# Patient Record
Sex: Male | Born: 1995
Health system: Southern US, Community
[De-identification: ages and names within clinical notes are randomized; demographics above are authoritative.]

## PROBLEM LIST (undated history)

## (undated) DIAGNOSIS — J45909 Unspecified asthma, uncomplicated: Secondary | ICD-10-CM

## (undated) DIAGNOSIS — E663 Overweight: Secondary | ICD-10-CM

## (undated) HISTORY — DX: Unspecified asthma, uncomplicated: J45.909

## (undated) HISTORY — DX: Overweight: E66.3

---
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
## 2015-06-16 MED FILL — VENTOLIN HFA 90 MCG INHALER: 108 (90 BAS | 16 days supply | Qty: 18 | Fill #2

## 2015-06-16 MED FILL — MONTELUKAST SOD 10 MG TAB: 10 | 90 days supply | Qty: 90 | Fill #1

## 2015-07-11 DIAGNOSIS — H10413 Chronic giant papillary conjunctivitis, bilateral: Secondary | ICD-10-CM | POA: Diagnosis not present

## 2015-07-11 MED FILL — VENTOLIN HFA 90 MCG INHALER: 108 (90 BAS | 17 days supply | Qty: 18 | Fill #0

## 2015-10-08 DIAGNOSIS — J309 Allergic rhinitis, unspecified: Secondary | ICD-10-CM | POA: Diagnosis not present

## 2015-10-08 DIAGNOSIS — J45990 Exercise induced bronchospasm: Secondary | ICD-10-CM | POA: Diagnosis not present

## 2015-10-08 DIAGNOSIS — J45998 Other asthma: Secondary | ICD-10-CM | POA: Diagnosis not present

## 2015-10-08 MED FILL — VENTOLIN HFA 90 MCG INHALER: 108 (90 BAS | 30 days supply | Qty: 36 | Fill #0

## 2015-10-08 MED FILL — MONTELUKAST SOD 10 MG TAB: 10 | 90 days supply | Qty: 90 | Fill #0

## 2015-12-08 DIAGNOSIS — S0990XA Unspecified injury of head, initial encounter: Secondary | ICD-10-CM | POA: Diagnosis not present

## 2015-12-08 DIAGNOSIS — Z7189 Other specified counseling: Secondary | ICD-10-CM | POA: Diagnosis not present

## 2015-12-08 DIAGNOSIS — Z23 Encounter for immunization: Secondary | ICD-10-CM | POA: Diagnosis not present

## 2015-12-24 MED FILL — MONTELUKAST SOD 10 MG TAB: 10 | 90 days supply | Qty: 90 | Fill #1

## 2015-12-24 MED FILL — VENTOLIN HFA 90 MCG INHALER: 108 (90 BAS | 30 days supply | Qty: 36 | Fill #1

## 2016-01-29 DIAGNOSIS — Z23 Encounter for immunization: Secondary | ICD-10-CM | POA: Diagnosis not present

## 2016-02-03 DIAGNOSIS — J45990 Exercise induced bronchospasm: Secondary | ICD-10-CM | POA: Diagnosis not present

## 2016-02-03 MED FILL — VENTOLIN HFA 90 MCG INHALER: 108 (90 BAS | 30 days supply | Qty: 36 | Fill #0

## 2016-04-05 MED FILL — MONTELUKAST SOD 10 MG TAB: 10 | 90 days supply | Qty: 90 | Fill #0

## 2016-04-07 MED FILL — VENTOLIN HFA 90 MCG INHALER: 108 (90 BAS | 30 days supply | Qty: 36 | Fill #1

## 2016-07-07 MED FILL — VENTOLIN HFA 90 MCG INHALER: 108 (90 BAS | 30 days supply | Qty: 36 | Fill #2

## 2016-07-07 MED FILL — MONTELUKAST SOD 10 MG TAB: 10 | 90 days supply | Qty: 90 | Fill #0

## 2016-07-09 DIAGNOSIS — J45998 Other asthma: Secondary | ICD-10-CM | POA: Diagnosis not present

## 2016-07-09 DIAGNOSIS — Z13220 Encounter for screening for lipoid disorders: Secondary | ICD-10-CM | POA: Diagnosis not present

## 2016-07-09 DIAGNOSIS — J309 Allergic rhinitis, unspecified: Secondary | ICD-10-CM | POA: Diagnosis not present

## 2016-07-09 DIAGNOSIS — J45990 Exercise induced bronchospasm: Secondary | ICD-10-CM | POA: Diagnosis not present

## 2016-07-09 DIAGNOSIS — Z833 Family history of diabetes mellitus: Secondary | ICD-10-CM | POA: Diagnosis not present

## 2016-07-10 DIAGNOSIS — J069 Acute upper respiratory infection, unspecified: Secondary | ICD-10-CM | POA: Diagnosis not present

## 2016-08-17 MED FILL — VENTOLIN HFA 90 MCG INHALER: 108 (90 BAS | 30 days supply | Qty: 36 | Fill #0

## 2016-09-01 DIAGNOSIS — H52221 Regular astigmatism, right eye: Secondary | ICD-10-CM | POA: Diagnosis not present

## 2016-09-15 DIAGNOSIS — R0981 Nasal congestion: Secondary | ICD-10-CM | POA: Diagnosis not present

## 2016-09-16 ENCOUNTER — Ambulatory Visit (HOSPITAL_COMMUNITY)
Admission: EM | Admit: 2016-09-16 | Discharge: 2016-09-16 | Disposition: A | Discharge status: Home / Self Care | Payer: 59 | Attending: Family Medicine | Admitting: Family Medicine

## 2016-09-16 ENCOUNTER — Encounter (HOSPITAL_COMMUNITY): Payer: Self-pay | Admitting: Emergency Medicine

## 2016-09-16 DIAGNOSIS — R05 Cough: Secondary | ICD-10-CM | POA: Diagnosis not present

## 2016-09-16 DIAGNOSIS — J039 Acute tonsillitis, unspecified: Secondary | ICD-10-CM

## 2016-09-16 DIAGNOSIS — R059 Cough, unspecified: Secondary | ICD-10-CM

## 2016-09-16 MED ORDER — AMOXICILLIN 875 MG PO TABS: 875.0000 mg | ORAL_TABLET | Freq: Two times a day (BID) | ORAL | 0 refills | Status: DC

## 2016-09-16 MED ORDER — HYDROCODONE-HOMATROPINE 5-1.5 MG/5ML PO SYRP: 5.0000 mL | ORAL_SOLUTION | Freq: Four times a day (QID) | ORAL | 0 refills | Status: DC | PRN

## 2016-09-16 MED FILL — HYDROCODONE-HOMATROPINE SYR: 5-1.5 | 3 days supply | Qty: 60 | Fill #0

## 2016-09-16 MED FILL — AMOXICILLIN 875 MG TABLET: 875 | 10 days supply | Qty: 20 | Fill #0

## 2016-09-16 NOTE — ED Triage Notes (Signed)
Here for a fever onset 3 days associated w/fatigue, back pain, dry cough, weakness  Reports he wen to his PCP yest and tested neg for strep and flu  Last had ibup at 1300  A&O x4... NAD

## 2016-09-16 NOTE — ED Provider Notes (Signed)
[facility]    CSN: [id number] Arrival date & time: 09/16/16  1633     History   Chief Complaint Chief Complaint  Patient presents with  . Fever    HPI Frank Brooks is a 21 y.o. male.   Here for a fever onset 3 days associated w/fatigue, back pain, dry cough, weakness  Reports he wen to his PCP yest and tested neg for strep and flu  Last had ibup at 1300      History reviewed. No pertinent past medical history.  There are no active problems to display for this patient.   History reviewed. No pertinent surgical history.     Home Medications    Prior to Admission medications   Medication Sig Start Date End Date Taking? Authorizing Provider  amoxicillin (AMOXIL) 875 MG tablet Take 1 tablet (875 mg total) by mouth 2 (two) times daily. 09/16/16   Elvina Sidle, MD  HYDROcodone-homatropine (HYDROMET) 5-1.5 MG/5ML syrup Take 5 mLs by mouth every 6 (six) hours as needed for cough. 09/16/16   Elvina Sidle, MD    Family History History reviewed. No pertinent family history.  Social History Social History  Substance Use Topics  . Smoking status: Never Smoker  . Smokeless tobacco: Never Used  . Alcohol use Yes     Allergies   Patient has no known allergies.   Review of Systems Review of Systems  Constitutional: Positive for chills, diaphoresis, fatigue and fever.  HENT: Positive for sore throat.   Respiratory: Positive for cough.   Gastrointestinal: Positive for nausea.  Musculoskeletal: Positive for myalgias.  Neurological: Negative.      Physical Exam Triage Vital Signs ED Triage Vitals  Enc Vitals Group     BP 09/16/16 1706 135/71     Pulse Rate 09/16/16 1706 73     Resp 09/16/16 1706 16     Temp 09/16/16 1706 98.2 F (36.8 C)     Temp Source 09/16/16 1706 Oral     SpO2 09/16/16 1706 98 %     Weight --      Height --      Head Circumference --      Peak Flow --      Pain Score 09/16/16 1708 5     Pain Loc --      Pain  Edu? --      Excl. in GC? --    No data found.   Updated Vital Signs BP 135/71 (BP Location: Left Arm)   Pulse 73   Temp 98.2 F (36.8 C) (Oral)   Resp 16   SpO2 98%    Physical Exam  Constitutional: He is oriented to person, place, and time. He appears well-developed and well-nourished.  HENT:  Right Ear: External ear normal.  Left Ear: External ear normal.  Face is flushed Tonsils are swollen and red with no exudates  Eyes: Conjunctivae and EOM are normal. Pupils are equal, round, and reactive to light.  Neck: Normal range of motion.  Cardiovascular: Normal rate, regular rhythm and normal heart sounds.   Pulmonary/Chest: Effort normal and breath sounds normal.  Musculoskeletal: Normal range of motion.  Neurological: He is alert and oriented to person, place, and time.  Skin: Skin is warm and dry.  Nursing note and vitals reviewed.    UC Treatments / Results  Labs (all labs ordered are listed, but only abnormal results are displayed) Labs Reviewed - No data to display  EKG  EKG Interpretation None  Radiology No results found.  Procedures Procedures (including critical care time)  Medications Ordered in UC Medications - No data to display   Initial Impression / Assessment and Plan / UC Course  I have reviewed the triage vital signs and the nursing notes.  Pertinent labs & imaging results that were available during my care of the patient were reviewed by me and considered in my medical decision making (see chart for details).     Final Clinical Impressions(s) / UC Diagnoses   Final diagnoses:  Tonsillitis  Cough    New Prescriptions New Prescriptions   AMOXICILLIN (AMOXIL) 875 MG TABLET    Take 1 tablet (875 mg total) by mouth 2 (two) times daily.   HYDROCODONE-HOMATROPINE (HYDROMET) 5-1.5 MG/5ML SYRUP    Take 5 mLs by mouth every 6 (six) hours as needed for cough.     Elvina Sidle, MD 09/16/16 1736

## 2016-10-20 MED FILL — MONTELUKAST SOD 10 MG TAB: 10 | 90 days supply | Qty: 90 | Fill #0

## 2016-10-20 MED FILL — VENTOLIN HFA 90 MCG INHALER: 108 (90 BAS | 30 days supply | Qty: 36 | Fill #1

## 2017-02-21 MED FILL — MONTELUKAST SOD 10 MG TAB: 10 | 90 days supply | Qty: 90 | Fill #1

## 2017-02-21 MED FILL — VENTOLIN HFA 90 MCG INHALER: 108 (90 BAS | 30 days supply | Qty: 36 | Fill #2

## 2017-03-28 DIAGNOSIS — J039 Acute tonsillitis, unspecified: Secondary | ICD-10-CM | POA: Diagnosis not present

## 2017-04-21 MED FILL — VENTOLIN HFA 90 MCG INHALER: 108 (90 BAS | 30 days supply | Qty: 36 | Fill #0

## 2017-06-24 MED FILL — VENTOLIN HFA 90 MCG INHALER: 108 (90 BAS | 30 days supply | Qty: 36 | Fill #0

## 2017-06-24 MED FILL — MONTELUKAST SOD 10 MG TAB: 10 | 90 days supply | Qty: 90 | Fill #0

## 2017-08-07 ENCOUNTER — Ambulatory Visit (HOSPITAL_COMMUNITY)
Admission: EM | Admit: 2017-08-07 | Discharge: 2017-08-07 | Disposition: A | Discharge status: Home / Self Care | Payer: No Typology Code available for payment source | Attending: Physician Assistant | Admitting: Physician Assistant

## 2017-08-07 ENCOUNTER — Other Ambulatory Visit: Payer: Self-pay

## 2017-08-07 ENCOUNTER — Encounter (HOSPITAL_COMMUNITY): Payer: Self-pay | Admitting: *Deleted

## 2017-08-07 DIAGNOSIS — J029 Acute pharyngitis, unspecified: Secondary | ICD-10-CM | POA: Insufficient documentation

## 2017-08-07 LAB — CBC WITH DIFFERENTIAL/PLATELET
Basophils Absolute: 0.1 10*3/uL (ref 0.0–0.1)
Basophils Relative: 1 %
Eosinophils Absolute: 0.1 10*3/uL (ref 0.0–0.7)
Eosinophils Relative: 2 %
HCT: 43.8 % (ref 39.0–52.0)
Hemoglobin: 15.5 g/dL (ref 13.0–17.0)
LYMPHS PCT: 25 %
Lymphs Abs: 1.3 10*3/uL (ref 0.7–4.0)
MCH: 32.4 pg (ref 26.0–34.0)
MCHC: 35.4 g/dL (ref 30.0–36.0)
MCV: 91.6 fL (ref 78.0–100.0)
MONO ABS: 0.7 10*3/uL (ref 0.1–1.0)
Monocytes Relative: 13 %
Neutro Abs: 3.1 10*3/uL (ref 1.7–7.7)
Neutrophils Relative %: 59 %
Platelets: 210 10*3/uL (ref 150–400)
RBC: 4.78 MIL/uL (ref 4.22–5.81)
RDW: 11.9 % (ref 11.5–15.5)
WBC: 5.2 10*3/uL (ref 4.0–10.5)

## 2017-08-07 LAB — POCT INFECTIOUS MONO SCREEN: Mono Screen: NEGATIVE

## 2017-08-07 LAB — POCT RAPID STREP A: Streptococcus, Group A Screen (Direct): NEGATIVE

## 2017-08-07 MED ORDER — NAPROXEN 500 MG PO TABS: 500.0000 mg | ORAL_TABLET | Freq: Two times a day (BID) | ORAL | 0 refills | Status: DC

## 2017-08-07 MED ORDER — AZITHROMYCIN 250 MG PO TABS: ORAL_TABLET | ORAL | 0 refills | Status: AC

## 2017-08-07 MED ORDER — LIDOCAINE VISCOUS 2 % MT SOLN: 5.0000 mL | OROMUCOSAL | 0 refills | Status: DC | PRN

## 2017-08-07 NOTE — ED Triage Notes (Signed)
Sore throat for 2 weeks

## 2017-08-07 NOTE — ED Provider Notes (Signed)
08/07/2017 2:04 PM   DOB: 05/01/1996 / MRN: [id number]  SUBJECTIVE:  Frank Brooks is a 22 y.o. male presenting for sore throat that started 2 weeks ago.  He tells me the symptoms are getting worse.  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  He denies weight loss.  Denies GERD.  He is a non-smoker.  He denies sneezing, ear itching, mouth itching, watery eyes and eye itching.  He denies fever.  He has No Known Allergies.   He  has no past medical history on file.    He  reports that  has never smoked. he has never used smokeless tobacco. He reports that he drinks about 0.6 oz of alcohol per week. He reports that he does not use drugs. He  has no sexual activity history on file. The patient  has no past surgical history on file.  His family history is not on file.  ROS  As per HPI otherwise negative  OBJECTIVE:  BP (!) 122/110 (BP Location: Left Arm)   Pulse 74   Temp 98.1 F (36.7 C) (Oral)   SpO2 96%   Blood pressure rechecked by me at  Physical Exam  Constitutional: He appears well-developed. He is active and cooperative.  Non-toxic appearance.  HENT:  Right Ear: Hearing, tympanic membrane, external ear and ear canal normal.  Left Ear: Hearing, tympanic membrane, external ear and ear canal normal.  Nose: Nose normal. Right sinus exhibits no maxillary sinus tenderness and no frontal sinus tenderness. Left sinus exhibits no maxillary sinus tenderness and no frontal sinus tenderness.  Mouth/Throat: Uvula is midline, oropharynx is clear and moist and mucous membranes are normal. No oropharyngeal exudate, posterior oropharyngeal edema or tonsillar abscesses.  Eyes: Conjunctivae are normal. Pupils are equal, round, and reactive to light.  Cardiovascular: Normal rate, regular rhythm, S1 normal, S2 normal, normal heart sounds, intact distal pulses and normal pulses. Exam reveals no gallop and no friction rub.  No murmur heard. Pulmonary/Chest: Effort normal. No stridor. No  tachypnea. No respiratory distress. He has no wheezes. He has no rales.  Abdominal: He exhibits no distension.  Musculoskeletal: He exhibits no edema.  Lymphadenopathy:       Head (right side): No submandibular and no tonsillar adenopathy present.       Head (left side): No submandibular and no tonsillar adenopathy present.    He has no cervical adenopathy.  Neurological: He is alert.  Skin: Skin is warm and dry. He is not diaphoretic. No pallor.  Vitals reviewed.   Results for orders placed or performed during the hospital encounter of 08/07/17 (from the past 72 hour(s))  POCT rapid strep A [facility])     Status: None   Collection Time: 08/07/17  2:00 PM  Result Value Ref Range   Streptococcus, Group A Screen (Direct) NEGATIVE NEGATIVE    No results found.  ASSESSMENT AND PLAN:  Orders Placed This Encounter  Procedures  . Culture, group A strep    Standing Status:   Standing    Number of Occurrences:   1  . CBC with Differential    Standing Status:   Standing    Number of Occurrences:   1  . POCT rapid strep A [facility])    Standing Status:   Standing    Number of Occurrences:   1     Sore throat rapid negative.  He has been having symptoms now for about 2 weeks with an  acute worsening today.  I will cover for strep throat and the culture is sent.  I am checking a CBC as this may be mono as well.  He has no lymphadenopathy about the head and neck.  I have given him viscous lidocaine for pain along with Aleve and advised if his strep is negative we will call and stop the antibiotic and he will have to stick with Aleve and lidocaine to give a virus time to resolve.  He does not participate in athletics.      The patient is advised to call or return to clinic if he does not see an improvement in symptoms, or to seek the care of the closest emergency department if he worsens with the above plan.   Deliah Boston, MHS, PA-C 08/07/2017 2:04 PM    Ofilia Neas,  PA-C 08/07/17 [phone]

## 2017-08-10 LAB — CULTURE, GROUP A STREP (THRC)

## 2017-12-05 MED FILL — VENTOLIN HFA 90 MCG INHALER: 108 (90 BAS | 33 days supply | Qty: 36 | Fill #1

## 2018-05-11 MED FILL — VENTOLIN HFA 90 MCG INHALER: 108 (90 BAS | 33 days supply | Qty: 36 | Fill #2

## 2018-07-05 ENCOUNTER — Encounter (HOSPITAL_COMMUNITY): Payer: Self-pay

## 2018-07-05 ENCOUNTER — Ambulatory Visit (HOSPITAL_COMMUNITY)
Admission: EM | Admit: 2018-07-05 | Discharge: 2018-07-05 | Disposition: A | Discharge status: Home / Self Care | Payer: No Typology Code available for payment source | Attending: Family Medicine | Admitting: Family Medicine

## 2018-07-05 DIAGNOSIS — L259 Unspecified contact dermatitis, unspecified cause: Secondary | ICD-10-CM | POA: Diagnosis not present

## 2018-07-05 MED ORDER — PREDNISONE 20 MG PO TABS: 20.0000 mg | ORAL_TABLET | Freq: Two times a day (BID) | ORAL | 0 refills | Status: AC

## 2018-07-05 MED ORDER — HYDROXYZINE HCL 25 MG PO TABS: 25.0000 mg | ORAL_TABLET | Freq: Every evening | ORAL | 0 refills | Status: DC | PRN

## 2018-07-05 MED FILL — hydrOXYzine HCL 25 MG TABS: 25 | 8 days supply | Qty: 8 | Fill #0

## 2018-07-05 MED FILL — predniSONE 20 MG TABS: 20 | 5 days supply | Qty: 10 | Fill #0

## 2018-07-05 NOTE — ED Provider Notes (Signed)
[facility]   [id number] 07/05/18 Arrival Time: 7893  CC: SKIN COMPLAINT  SUBJECTIVE:  Frank Brooks is a 23 y.o. male who presents with a skin rash to bilateral forearms, neck, and face x 1 day.  Denies precipitating event or trauma.  Denies changes in soaps, detergents, close contacts with similar rash, known trigger or allergy. Denies medications change or starting a new medication recently.  Rash diffuse about the bilateral forearms, neck, and face.  Describes it as itchy, red, and spreading.  Has tried benadryl with temporary relief.  Denies aggravating factors.  Reports similar symptoms in the past with poison ivy exposure.  Denies fever, chills, nausea, vomiting, swelling, discharge, oral lesions, SOB, chest pain, abdominal pain, changes in bowel or bladder function.    ROS: As per HPI.  History reviewed. No pertinent past medical history. History reviewed. No pertinent surgical history. Allergies  Allergen Reactions  . Dust Mite Extract   . Latex    No current facility-administered medications on file prior to encounter.    No current outpatient medications on file prior to encounter.   Social History   Socioeconomic History  . Marital status: Single    Spouse name: Not on file  . Number of children: Not on file  . Years of education: Not on file  . Highest education level: Not on file  Occupational History  . Not on file  Social Needs  . Financial resource strain: Not on file  . Food insecurity:    Worry: Not on file    Inability: Not on file  . Transportation needs:    Medical: Not on file    Non-medical: Not on file  Tobacco Use  . Smoking status: Never Smoker  . Smokeless tobacco: Never Used  Substance and Sexual Activity  . Alcohol use: Yes    Alcohol/week: 1.0 standard drinks    Types: 1 Cans of beer per week    Comment: Occas.  . Drug use: No  . Sexual activity: Not on file  Lifestyle  . Physical activity:    Days per week: Not on file      Minutes per session: Not on file  . Stress: Not on file  Relationships  . Social connections:    Talks on phone: Not on file    Gets together: Not on file    Attends religious service: Not on file    Active member of club or organization: Not on file    Attends meetings of clubs or organizations: Not on file    Relationship status: Not on file  . Intimate partner violence:    Fear of current or ex partner: Not on file    Emotionally abused: Not on file    Physically abused: Not on file    Forced sexual activity: Not on file  Other Topics Concern  . Not on file  Social History Narrative  . Not on file   Family History  Problem Relation Age of Onset  . Healthy Mother   . Healthy Father     OBJECTIVE: Vitals:   07/05/18 0837  BP: (!) 147/76  Pulse: 85  Resp: 18  Temp: 97.8 F (36.6 C)  TempSrc: Oral  SpO2: 96%    General appearance: alert; no distress Head: NCAT Lungs: clear to auscultation bilaterally Heart: regular rate and rhythm.  Radial pulse 2+ bilaterally Extremities: no edema Skin: warm and dry; erythematous maculopapular rash diffuse about bilateral forearms with minimal involvement of posterior neck,  and nasolabial folds; blanches with pressure; NT; no obvious bleeding or drainage (see picture below) Psychological: alert and cooperative; normal mood and affect      ASSESSMENT & PLAN:  1. Contact dermatitis, unspecified contact dermatitis type, unspecified trigger     Meds ordered this encounter  Medications  . predniSONE (DELTASONE) 20 MG tablet    Sig: Take 1 tablet (20 mg total) by mouth 2 (two) times daily with a meal for 5 days.    Dispense:  10 tablet    Refill:  0    Order Specific Question:   Supervising Provider    Answer:   Eustace MooreNELSON, YVONNE SUE [[id number]]  . hydrOXYzine (ATARAX/VISTARIL) 25 MG tablet    Sig: Take 1 tablet (25 mg total) by mouth at bedtime as needed for itching.    Dispense:  8 tablet    Refill:  0    Order Specific  Question:   Supervising Provider    Answer:   Eustace MooreELSON, YVONNE SUE [[id number]]    Wash with warm water and mild soap Take oral steroid as prescribed and to completion Prescribed hydroxyzine as needed for itching.  DO NOT TAKE while driving or operating heavy machinery You may take an OTC allergy medication like zyrtec, allegra, or claritin during the day for relief of daytime itching Follow up with PCP if symptoms persists Return or go to the ED if you have any new or worsening symptoms such as fever, chills, nausea, vomiting, difficulty breathing, difficulty swallowing, swelling or tingling in your mouth, abdominal pain, chest pain, shortness of breath, etc....  Reviewed expectations re: course of current medical issues. Questions answered. Outlined signs and symptoms indicating need for more acute intervention. Patient verbalized understanding. After Visit Summary given.   Rennis HardingWurst, Yu [place], PA-C 07/05/18 1114

## 2018-07-05 NOTE — Discharge Instructions (Signed)
Wash with warm water and mild soap Take oral steroid as prescribed and to completion Prescribed hydroxyzine as needed for itching.  DO NOT TAKE while driving or operating heavy machinery You may take an OTC allergy medication like zyrtec, allegra, or claritin during the day for relief of daytime itching Follow up with PCP if symptoms persists Return or go to the ED if you have any new or worsening symptoms such as fever, chills, nausea, vomiting, difficulty breathing, difficulty swallowing, swelling or tingling in your mouth, abdominal pain, chest pain, shortness of breath, etc..Marland KitchenMarland Kitchen

## 2018-07-05 NOTE — ED Triage Notes (Signed)
Pt presents with rash on arms and around face and neck area.

## 2018-07-07 MED FILL — predniSONE 10 MG TABS: 10 | 12 days supply | Qty: 48 | Fill #0

## 2018-07-17 ENCOUNTER — Other Ambulatory Visit (HOSPITAL_COMMUNITY): Payer: Self-pay | Admitting: Family Medicine

## 2018-07-17 DIAGNOSIS — R109 Unspecified abdominal pain: Secondary | ICD-10-CM

## 2018-07-17 MED FILL — MONTELUKAST SOD 10 MG TAB: 10 | 90 days supply | Qty: 90 | Fill #0

## 2018-07-17 MED FILL — VENTOLIN HFA 90 MCG INHALER: 108 (90 BAS | 33 days supply | Qty: 36 | Fill #0

## 2018-07-19 ENCOUNTER — Other Ambulatory Visit (HOSPITAL_COMMUNITY): Payer: Self-pay | Admitting: Family Medicine

## 2018-07-19 ENCOUNTER — Ambulatory Visit (HOSPITAL_COMMUNITY)
Admission: RE | Admit: 2018-07-19 | Discharge: 2018-07-19 | Disposition: A | Discharge status: Home / Self Care | Payer: No Typology Code available for payment source | Source: Ambulatory Visit | Attending: Family Medicine | Admitting: Family Medicine

## 2018-07-19 DIAGNOSIS — R109 Unspecified abdominal pain: Secondary | ICD-10-CM

## 2018-07-19 MED ORDER — IOHEXOL 300 MG/ML  SOLN
100.0000 mL | Freq: Once | INTRAMUSCULAR | Status: AC | PRN
  Administered 2018-07-19: 100 mL via INTRAVENOUS

## 2018-08-15 ENCOUNTER — Other Ambulatory Visit (HOSPITAL_BASED_OUTPATIENT_CLINIC_OR_DEPARTMENT_OTHER): Payer: Self-pay

## 2018-08-15 DIAGNOSIS — R0683 Snoring: Secondary | ICD-10-CM

## 2018-08-15 DIAGNOSIS — G4730 Sleep apnea, unspecified: Secondary | ICD-10-CM

## 2018-08-15 DIAGNOSIS — G4710 Hypersomnia, unspecified: Secondary | ICD-10-CM

## 2018-08-29 ENCOUNTER — Encounter (HOSPITAL_BASED_OUTPATIENT_CLINIC_OR_DEPARTMENT_OTHER): Payer: No Typology Code available for payment source

## 2018-10-10 ENCOUNTER — Encounter (HOSPITAL_BASED_OUTPATIENT_CLINIC_OR_DEPARTMENT_OTHER): Payer: No Typology Code available for payment source

## 2018-11-01 ENCOUNTER — Ambulatory Visit (HOSPITAL_BASED_OUTPATIENT_CLINIC_OR_DEPARTMENT_OTHER): Payer: No Typology Code available for payment source

## 2018-11-24 ENCOUNTER — Other Ambulatory Visit (HOSPITAL_COMMUNITY): Payer: No Typology Code available for payment source

## 2018-11-28 ENCOUNTER — Other Ambulatory Visit: Payer: Self-pay

## 2018-11-28 ENCOUNTER — Ambulatory Visit (HOSPITAL_BASED_OUTPATIENT_CLINIC_OR_DEPARTMENT_OTHER): Payer: No Typology Code available for payment source | Attending: Internal Medicine | Admitting: Internal Medicine

## 2018-11-28 DIAGNOSIS — G4730 Sleep apnea, unspecified: Secondary | ICD-10-CM | POA: Insufficient documentation

## 2018-11-28 DIAGNOSIS — R0683 Snoring: Secondary | ICD-10-CM | POA: Insufficient documentation

## 2018-11-28 DIAGNOSIS — G4710 Hypersomnia, unspecified: Secondary | ICD-10-CM | POA: Insufficient documentation

## 2018-12-03 NOTE — Procedures (Signed)
    NAME: Frank Brooks DATE OF BIRTH:  18-Nov-1995 MEDICAL RECORD NUMBER [id number]  LOCATION: [facility]  PHYSICIAN: Marius Ditch  DATE OF STUDY: 11/28/2018  SLEEP STUDY TYPE: Out of Center Sleep Test                REFERRING PHYSICIAN: Marius Ditch, MD  INDICATION FOR STUDY: witnessed apnea, dream enactment, sleep related hallucinations, sleep paralysis, mild excessive daytime sleepiess.   EPWORTH SLEEPINESS SCORE:  6 HEIGHT: 6\' 4"  (193 cm)  WEIGHT: 210 lb (95.3 kg)    Body mass index is 25.56 kg/m.  NECK SIZE: 15.5 in.  MEDICATIONS  Patient self administered medications include: N/A. Medications administered during study include No sleep medicine administered.Marland Kitchen   SLEEP STUDY TECHNIQUE  A multi-channel overnight portable sleep study was performed. The channels recorded were: nasal and oral airflow, thoracic and abdominal respiratory movement, and oxygen saturation with a pulse oximetry. Snoring and body position were also monitored.  TECHNICIAN COMMENTS  Comments added by Technician: N/A Comments added by Scorer: N/A   RECORDING SUMMARY  The study was initiated at 11:12:05 PM and terminated at 6:06:59 AM. The total recorded time was 414.9 minutes. Time in bed was 414.9 minutes.   RESPIRATORY PARAMETERS  The overall AHI was 1.3 per hour, with a central apnea index of 0.0 per hour. The sleep efficiency was 99.5 % and the patient was supine for 80.2%. The arousal index was 0.0 per hour.The oxygen nadir was 94% during sleep. Many episodes of decreased flow were noted with elevations in heart rate (although not > 100 BPM) but without desaturations. These may represent RERAs.   CARDIAC DATA  Mean heart rate during sleep was 62.8 bpm. However, considerable heart rate variation was noted; it never was over 100 BPM.   IMPRESSIONS  No Significant Obstructive Sleep apnea(OSA)  DIAGNOSIS  Normal study  RECOMMENDATIONS  Due to the patient's many sleep  related symptoms, a PSG and MSLT are recommended.   Marius Ditch Sleep specialist, [place] Board of [employer] Medicine  ELECTRONICALLY SIGNED ON:  12/03/2018, 8:52 PM [place] PH: [phone]   FX: [phone] [place]

## 2018-12-13 ENCOUNTER — Other Ambulatory Visit (HOSPITAL_BASED_OUTPATIENT_CLINIC_OR_DEPARTMENT_OTHER): Payer: Self-pay

## 2018-12-13 DIAGNOSIS — G478 Other sleep disorders: Secondary | ICD-10-CM

## 2018-12-13 DIAGNOSIS — G4752 REM sleep behavior disorder: Secondary | ICD-10-CM

## 2018-12-13 DIAGNOSIS — R443 Hallucinations, unspecified: Secondary | ICD-10-CM

## 2018-12-13 DIAGNOSIS — G4710 Hypersomnia, unspecified: Secondary | ICD-10-CM

## 2018-12-25 ENCOUNTER — Encounter: Payer: Self-pay | Admitting: Emergency Medicine

## 2018-12-25 ENCOUNTER — Emergency Department
Admission: EM | Admit: 2018-12-25 | Discharge: 2018-12-25 | Disposition: A | Discharge status: Home / Self Care | Payer: No Typology Code available for payment source | Source: Home / Self Care | Attending: Family Medicine | Admitting: Family Medicine

## 2018-12-25 ENCOUNTER — Other Ambulatory Visit: Payer: Self-pay

## 2018-12-25 DIAGNOSIS — Z23 Encounter for immunization: Secondary | ICD-10-CM

## 2018-12-25 DIAGNOSIS — S51011A Laceration without foreign body of right elbow, initial encounter: Secondary | ICD-10-CM | POA: Diagnosis not present

## 2018-12-25 MED ORDER — TETANUS-DIPHTH-ACELL PERTUSSIS 5-2.5-18.5 LF-MCG/0.5 IM SUSP
0.5000 mL | Freq: Once | INTRAMUSCULAR | Status: AC
  Administered 2018-12-25: 0.5 mL via INTRAMUSCULAR

## 2018-12-25 NOTE — Discharge Instructions (Signed)
Avoid getting wound wet for 24-48 hours. You may then gently clean the area with warm water and mild soap. Do not soak your arm in a bath, pool, lake or other body of water.  Pat dry the wound. You should keep wound covered while at work to help protect from trauma as well as help keep clean.   Please follow up with family medicine or return to urgent care in 10-14 days for remove of the stitches.  Return sooner if concern for infection- increased pain, redness, swelling, drainage of pus.

## 2018-12-25 NOTE — ED Triage Notes (Signed)
RT arm laceration, today a piece of tile fell off the wall onto his upper arm making a small cut.

## 2018-12-25 NOTE — ED Provider Notes (Signed)
[facility]    CSN: [id number] Arrival date & time: 12/25/18  1529     History   Chief Complaint Chief Complaint  Patient presents with  . Extremity Laceration    HPI Frank Brooks is a 23 y.o. male.   HPI  Frank Brooks is a 23 y.o. male presenting to UC with c/o laceration to his Right upper arm, just above his elbow after having a piece of tile fall from a wall and cut his arm.  Bleeding controlled PTA. 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  He is not sure of his last tetanus.  Pain is minimal.     History reviewed. No pertinent past medical history.  There are no active problems to display for this patient.   History reviewed. No pertinent surgical history.     Home Medications    Prior to Admission medications   Not on File    Family History Family History  Problem Relation Age of Onset  . Healthy Mother   . Healthy Father     Social History Social History   Tobacco Use  . Smoking status: Never Smoker  . Smokeless tobacco: Never Used  Substance Use Topics  . Alcohol use: Yes    Alcohol/week: 1.0 standard drinks    Types: 1 Cans of beer per week    Comment: Occas.  . Drug use: No     Allergies   Dust mite extract and Latex   Review of Systems Review of Systems  Musculoskeletal: Negative for arthralgias, joint swelling and myalgias.  Skin: Positive for wound. Negative for color change.  Neurological: Negative for weakness and numbness.     Physical Exam Triage Vital Signs ED Triage Vitals  Enc Vitals Group     BP      Pulse      Resp      Temp      Temp src      SpO2      Weight      Height      Head Circumference      Peak Flow      Pain Score      Pain Loc      Pain Edu?      Excl. in GC?    No data found.  Updated Vital Signs BP 122/72 (BP Location: Right Arm)   Pulse 75   Temp 98.6 F (37 C) (Oral)   SpO2 99%   Visual Acuity Right Eye Distance:   Left Eye  Distance:   Bilateral Distance:    Right Eye Near:   Left Eye Near:    Bilateral Near:     Physical Exam Vitals signs and nursing note reviewed.  Constitutional:      Appearance: Normal appearance. He is well-developed.  HENT:     Head: Normocephalic and atraumatic.  Neck:     Musculoskeletal: Normal range of motion.  Cardiovascular:     Rate and Rhythm: Normal rate.  Pulmonary:     Effort: Pulmonary effort is normal.  Musculoskeletal: Normal range of motion.        General: No swelling or tenderness.     Comments: Right elbow: no edema, full ROM  Skin:    General: Skin is warm and dry.     Comments: Right upper arm: 2cm laceration proximal to elbow. No active bleeding. No foreign bodies noted.   Neurological:  Mental Status: He is alert and oriented to person, place, and time.  Psychiatric:        Behavior: Behavior normal.      UC Treatments / Results  Labs (all labs ordered are listed, but only abnormal results are displayed) Labs Reviewed - No data to display  EKG   Radiology No results found.  Procedures Laceration Repair  Date/Time: 12/25/2018 5:39 PM Performed by: Noe Gens, PA-C Authorized by: Kandra Nicolas, MD   Consent:    Consent obtained:  Verbal   Consent given by:  Patient   Risks discussed:  Infection, pain and poor wound healing   Alternatives discussed:  No treatment and delayed treatment Anesthesia (see MAR for exact dosages):    Anesthesia method:  Local infiltration   Local anesthetic:  Lidocaine 1% WITH epi Laceration details:    Location:  Shoulder/arm   Shoulder/arm location:  R upper arm   Length (cm):  2   Depth (mm):  3 Repair type:    Repair type:  Simple Pre-procedure details:    Preparation:  Patient was prepped and draped in usual sterile fashion Exploration:    Hemostasis achieved with:  Direct pressure   Wound exploration: wound explored through full range of motion and entire depth of wound probed and  visualized     Wound extent: no areolar tissue violation noted, no fascia violation noted, no foreign bodies/material noted, no muscle damage noted, no nerve damage noted, no tendon damage noted, no underlying fracture noted and no vascular damage noted     Contaminated: no   Treatment:    Area cleansed with:  Saline and Hibiclens   Amount of cleaning:  Standard Skin repair:    Repair method:  Sutures and Steri-Strips   Suture size:  4-0   Suture material:  Prolene   Suture technique:  Simple interrupted   Number of sutures:  3   Number of Steri-Strips:  1 Approximation:    Approximation:  Close Post-procedure details:    Dressing:  Bulky dressing   Patient tolerance of procedure:  Tolerated well, no immediate complications   (including critical care time)  Medications Ordered in UC Medications  Tdap (BOOSTRIX) injection 0.5 mL (0.5 mLs Intramuscular Given 12/25/18 1620)    Initial Impression / Assessment and Plan / UC Course  I have reviewed the triage vital signs and the nursing notes.  Pertinent labs & imaging results that were available during my care of the patient were reviewed by me and considered in my medical decision making (see chart for details).     Tdap updated today Laceration closed as noted above Given location of wound and his occupation, encouraged to keep wound covered at work. F/u in 10-14 days for suture removal, sooner if concern for infection.  Final Clinical Impressions(s) / UC Diagnoses   Final diagnoses:  Elbow laceration, right, initial encounter     Discharge Instructions     Avoid getting wound wet for 24-48 hours. You may then gently clean the area with warm water and mild soap. Do not soak your arm in a bath, pool, lake or other body of water.  Pat dry the wound. You should keep wound covered while at work to help protect from trauma as well as help keep clean.   Please follow up with family medicine or return to urgent care in 10-14  days for remove of the stitches.  Return sooner if concern for infection- increased pain, redness, swelling, drainage  of pus.    ED Prescriptions    None     Controlled Substance Prescriptions [place] Controlled Substance Registry consulted? Not Applicable   Rolla Platehelps, Winnie Umali O, PA-C 12/25/18 1740

## 2019-01-05 ENCOUNTER — Other Ambulatory Visit (HOSPITAL_COMMUNITY)
Admission: RE | Admit: 2019-01-05 | Discharge: 2019-01-05 | Disposition: A | Discharge status: Home / Self Care | Payer: No Typology Code available for payment source | Source: Ambulatory Visit | Attending: Internal Medicine | Admitting: Internal Medicine

## 2019-01-05 DIAGNOSIS — Z01812 Encounter for preprocedural laboratory examination: Secondary | ICD-10-CM | POA: Diagnosis present

## 2019-01-05 DIAGNOSIS — Z20828 Contact with and (suspected) exposure to other viral communicable diseases: Secondary | ICD-10-CM | POA: Diagnosis not present

## 2019-01-06 LAB — SARS CORONAVIRUS 2 (TAT 6-24 HRS): SARS Coronavirus 2: NEGATIVE

## 2019-01-07 ENCOUNTER — Ambulatory Visit (HOSPITAL_BASED_OUTPATIENT_CLINIC_OR_DEPARTMENT_OTHER): Payer: No Typology Code available for payment source | Attending: Internal Medicine | Admitting: Internal Medicine

## 2019-01-07 ENCOUNTER — Other Ambulatory Visit: Payer: Self-pay

## 2019-01-07 DIAGNOSIS — F515 Nightmare disorder: Secondary | ICD-10-CM | POA: Insufficient documentation

## 2019-01-07 DIAGNOSIS — G4710 Hypersomnia, unspecified: Secondary | ICD-10-CM | POA: Diagnosis present

## 2019-01-07 DIAGNOSIS — G478 Other sleep disorders: Secondary | ICD-10-CM | POA: Insufficient documentation

## 2019-01-07 DIAGNOSIS — G4752 REM sleep behavior disorder: Secondary | ICD-10-CM

## 2019-01-07 DIAGNOSIS — R443 Hallucinations, unspecified: Secondary | ICD-10-CM | POA: Insufficient documentation

## 2019-01-08 ENCOUNTER — Other Ambulatory Visit: Payer: Self-pay

## 2019-01-08 ENCOUNTER — Ambulatory Visit (HOSPITAL_BASED_OUTPATIENT_CLINIC_OR_DEPARTMENT_OTHER): Payer: No Typology Code available for payment source | Attending: Internal Medicine | Admitting: Internal Medicine

## 2019-01-08 DIAGNOSIS — F515 Nightmare disorder: Secondary | ICD-10-CM | POA: Insufficient documentation

## 2019-01-08 DIAGNOSIS — G47419 Narcolepsy without cataplexy: Secondary | ICD-10-CM | POA: Insufficient documentation

## 2019-01-08 DIAGNOSIS — G4710 Hypersomnia, unspecified: Secondary | ICD-10-CM | POA: Diagnosis present

## 2019-01-08 DIAGNOSIS — G478 Other sleep disorders: Secondary | ICD-10-CM | POA: Diagnosis not present

## 2019-01-08 DIAGNOSIS — R443 Hallucinations, unspecified: Secondary | ICD-10-CM | POA: Insufficient documentation

## 2019-01-08 DIAGNOSIS — G4752 REM sleep behavior disorder: Secondary | ICD-10-CM

## 2019-01-09 NOTE — Procedures (Signed)
    NAME: Frank Brooks DATE OF BIRTH:  09-01-1995 MEDICAL RECORD NUMBER [id number]  LOCATION: [facility]  PHYSICIAN: Marius Ditch  DATE OF STUDY: 01/08/2019  SLEEP STUDY TYPE: Multiple Sleep Latency Test               REFERRING PHYSICIAN: Marius Ditch, MD  INDICATION FOR STUDY: excessive daytime sleepiness, sleep related hallucinations, sleep paralysis, dream enactment  EPWORTH SLEEPINESS SCORE:  6 HEIGHT: 6\' 4"  (193 cm)  WEIGHT: 205 lb (93 kg)    Body mass index is 24.95 kg/m.  NECK SIZE: 16.5 in.  MEDICATIONS  Patient self administered medications include: N/A. Medications administered during study include No sleep medicine administered.Marland Kitchen   SLEEP STUDY TECHNIQUE  A multiple sleep latency test was performed. The channels recorded and monitored were central and occipital EEG, electrooculogram (EOG), submentalis EMG (chin), and electrocardiogram.   TECHNICAL COMMENTS  Comments added by Technician: NONE Comments added by Scorer: N/A   IMPRESSIONS  Total number of naps attempted: 5 . Total number of naps with sleep attained: 5  Pathologic sleepiness was evidenced by short mean sleep latency of 2:02 minutes Three sleep onset REM periods present over 5 naps. This study suggests narcolepsy.  DIAGNOSIS  Narcolepsy  RECOMMENDATIONS  Return for follow up and management of narcolepsy   Marius Ditch Sleep specialist, American Board of Internal Medicine  ELECTRONICALLY SIGNED ON:  01/09/2019, 9:09 PM [place] PH: [phone]   FX: [phone] [place]

## 2019-01-09 NOTE — Procedures (Signed)
   NAME: Frank Brooks DATE OF BIRTH:  [birth year] MEDICAL RECORD NUMBER [id number]  LOCATION: [facility]  PHYSICIAN: Marius Ditch  DATE OF STUDY: 01/07/2019  SLEEP STUDY TYPE: Nocturnal Polysomnogram               REFERRING PHYSICIAN: Marius Ditch, MD  INDICATION FOR STUDY: Fatigue, dream enactment, sleep related hallucinations, and sleep paralysis  EPWORTH SLEEPINESS SCORE:  6 HEIGHT: 6\' 4"  (193 cm)  WEIGHT: 205 lb (93 kg)    Body mass index is 24.95 kg/m.  NECK SIZE: 16.5 in.  MEDICATIONS  Patient self administered medications include: N/A. Medications administered during study include No sleep medicine administered.Marland Kitchen   SLEEP STUDY TECHNIQUE  A multi-channel overnight Polysomnography study was performed. The channels recorded and monitored were central and occipital EEG, electrooculogram (EOG), submentalis EMG (chin), nasal and oral airflow, thoracic and abdominal wall motion, anterior tibialis EMG, snore microphone, electrocardiogram, and a pulse oximetry.   TECHNICAL COMMENTS  Comments added by Technician: NO RESTROOM VISITS Comments added by Scorer: N/A  SLEEP ARCHITECTURE  The study was initiated at 10:52:52 PM and terminated at 6:00:25 AM. The total recorded time was 427.6 minutes. EEG confirmed total sleep time was 418 minutes yielding a sleep efficiency of 97.8%%. Sleep onset after lights out was 4.0 minutes with a REM latency of 54.0 minutes. The patient spent 0.7%% of the night in stage N1 sleep, 46.3%% in stage N2 sleep, 23.3%% in stage N3 and 29.7% in REM. Wake after sleep onset (WASO) was 5.6 minutes. The Arousal Index was 8.3/hour.   RESPIRATORY PARAMETERS  There were a total of 0 respiratory disturbances out of which 0 were apneas ( 0 obstructive, 0 mixed, 0 central) and 0 hypopneas. The apnea/hypopnea index (AHI) was 0.0 events/hour. The central sleep apnea index was 0.0 events/hour. The REM AHI was 0.0 events/hour and NREM AHI was 0.0  events/hour. The supine AHI was 0.0 events/hour and the non supine AHI was 0 supine during 40.45% of sleep. Respiratory disturbances were associated with oxygen desaturation down to a nadir of 87.0% during sleep. The mean oxygen saturation during the study was 94.7%. The cumulative time under 88% oxygen saturation was 5.5 minutes.   LEG MOVEMENT DATA  The total leg movements were 5 with a resulting leg movement index of 0.7/hr . Associated arousal with leg movement index was 0.0/hr.   CARDIAC DATA  The underlying cardiac rhythm was most consistent with sinus rhythm. Mean heart rate during sleep was 61.3 bpm. Additional rhythm abnormalities include None.   IMPRESSIONS  No Significant Obstructive Sleep apnea(OSA)  No Significant Central Sleep Apnea (CSA) No significant periodic leg movements(PLMs) during sleep.   DIAGNOSIS  Excessive daytime sleepiness.   RECOMMENDATIONS  Patient may proceed with MSLT.   Marius Ditch Sleep specialisty, American Board of Internal Medicine  ELECTRONICALLY SIGNED ON:  01/09/2019, 8:36 PM [place] PH: [phone]   FX: [phone] [place]

## 2019-01-26 MED FILL — ALBUTEROL SULFATE HFA 108 (: 108 (90 BAS | 33 days supply | Qty: 36 | Fill #1

## 2019-04-19 MED FILL — clonazePAM 0.125 MG TBDP: 0.125 | 30 days supply | Qty: 30 | Fill #0

## 2019-05-10 MED FILL — clonazePAM 0.125 MG TBDP: 0.125 | 30 days supply | Qty: 30 | Fill #0

## 2019-07-06 IMAGING — CT CT ABD-PELV W/ CM
2 of 4 series · 16 of 46 positions shown, 18 images · IV contrast (Omni 300)
Comparison: None.

CLINICAL DATA: Right lower quadrant abdominal pain of unspecified
duration. Attention appendix.

EXAM:
CT ABDOMEN AND PELVIS WITH CONTRAST
TECHNIQUE: Multidetector CT imaging of the abdomen and pelvis was performed
using the standard protocol following bolus administration of
intravenous contrast.
CONTRAST:  100mL OMNIPAQUE IOHEXOL 300 MG/ML  SOLN

[Series 3: a/p w/ 5mm · axial · 0.78mm/px · z∈[+737,+1182]mm · 13 of 99 slices shown, 15 images]
[im 5/99  soft-tissue]
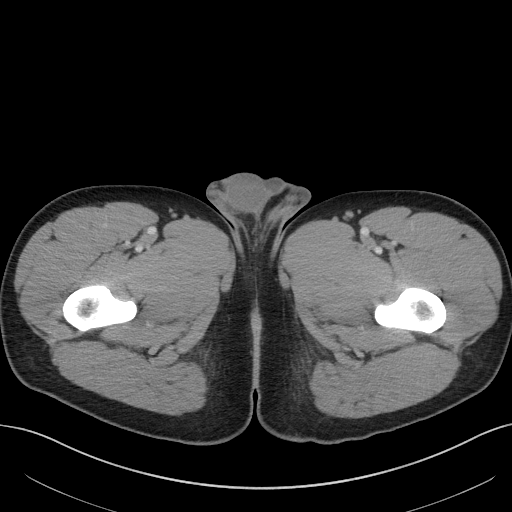
[im 5/99  bone]
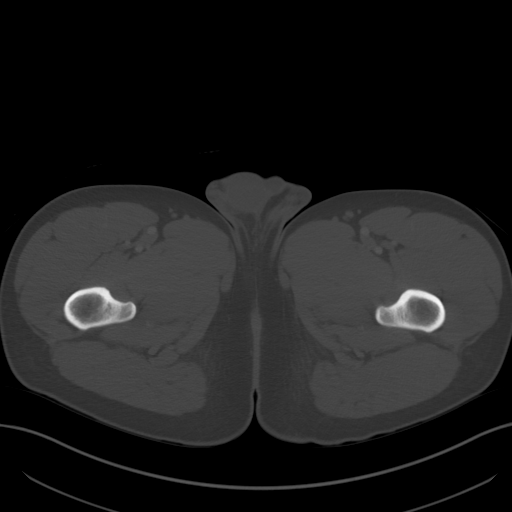
[im 13/99  soft-tissue]
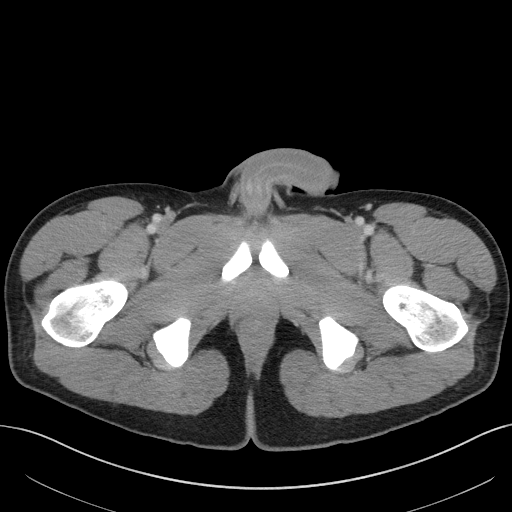
[im 21/99  soft-tissue]
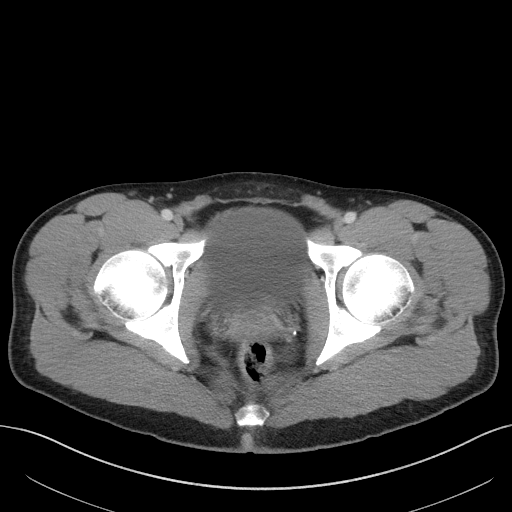
[im 29/99  soft-tissue]
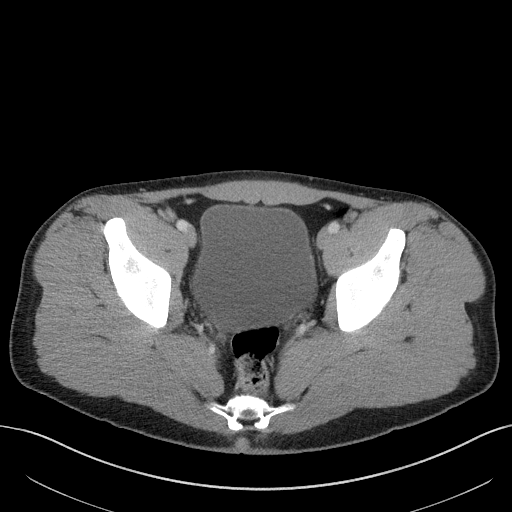
[im 33/99  soft-tissue]
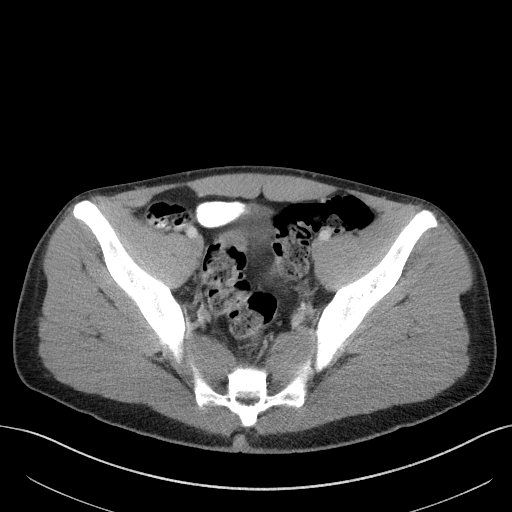
[im 41/99  soft-tissue]
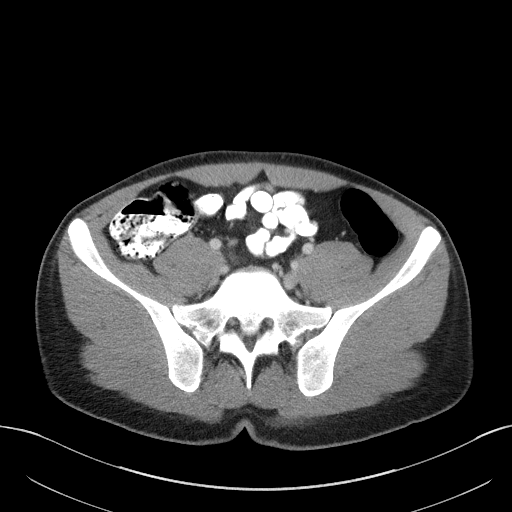
[im 50/99  soft-tissue]
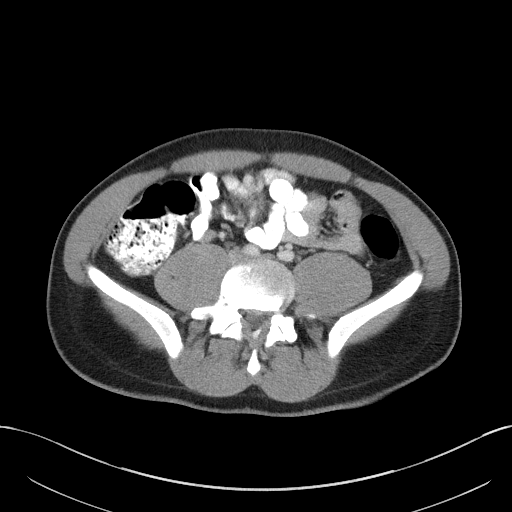
[im 58/99  soft-tissue]
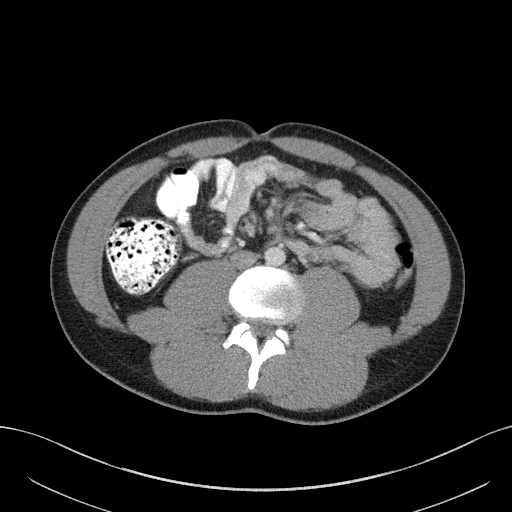
[im 66/99  soft-tissue]
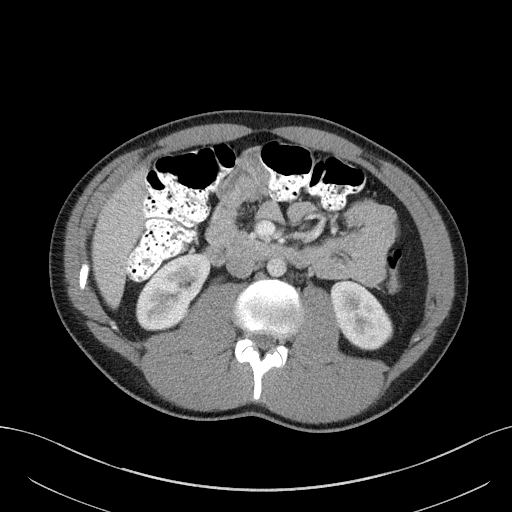
[im 66/99  bone]
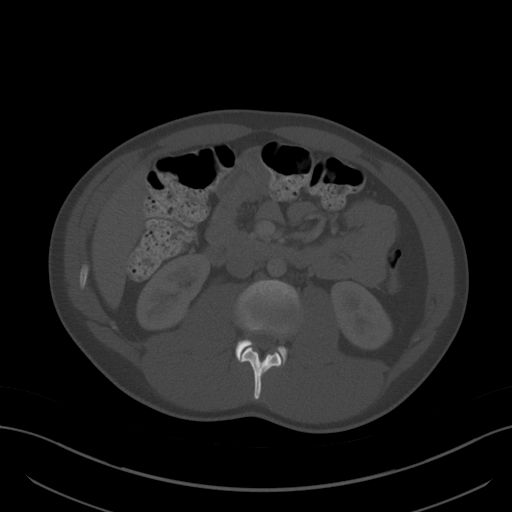
[im 70/99  soft-tissue]
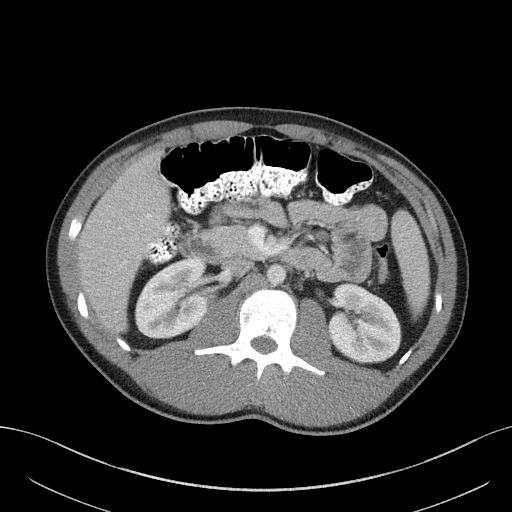
[im 78/99  soft-tissue]
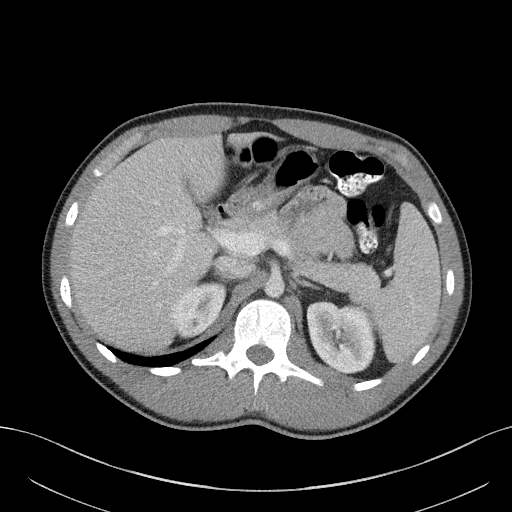
[im 86/99  soft-tissue]
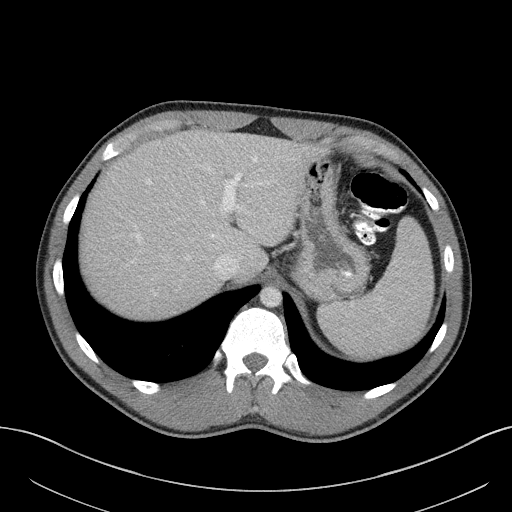
[im 94/99  soft-tissue]
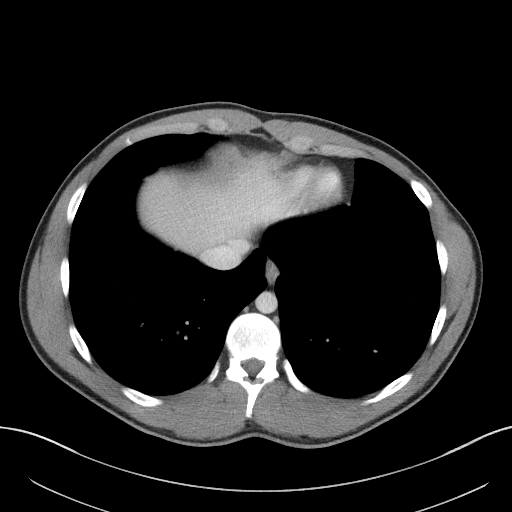

[Series 6: a/p w/ cor · coronal · 0.80mm/px · 3 of 157 slices shown]
[im 53/157  soft-tissue]
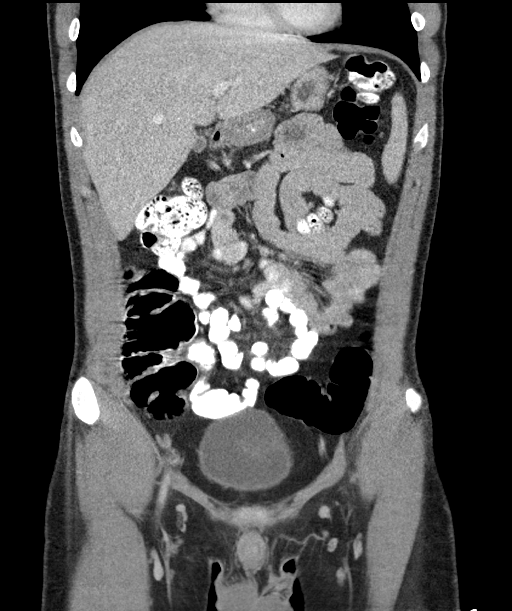
[im 70/157  soft-tissue]
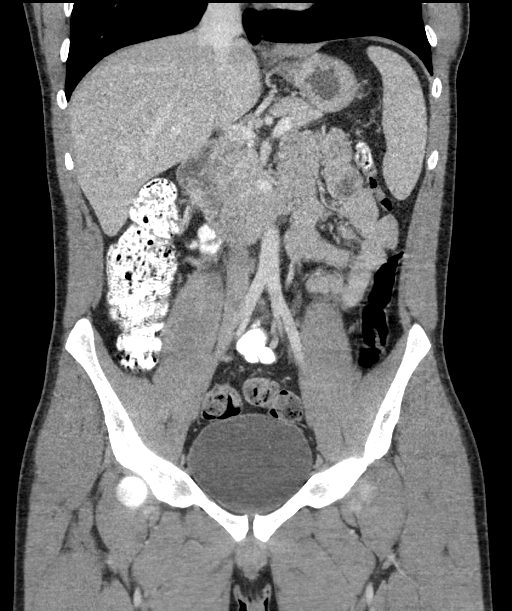
[im 87/157  soft-tissue]
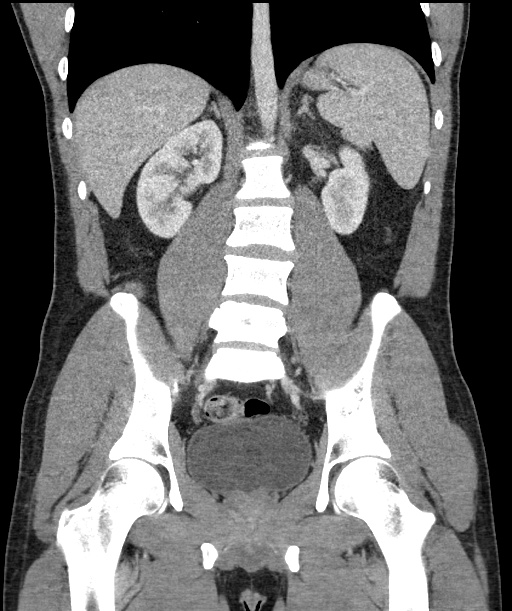

[16 of 46 positions shown; findings below may reference images not displayed]

FINDINGS: Lower chest: Clear lung bases. No significant pleural or pericardial
effusion.

Hepatobiliary: The liver is normal in density without suspicious
focal abnormality. No evidence of gallstones, gallbladder wall
thickening or biliary dilatation.

Pancreas: Unremarkable. No pancreatic ductal dilatation or
surrounding inflammatory changes.

Spleen: Normal in size without focal abnormality.  Small splenule.

Adrenals/Urinary Tract: Both adrenal glands appear normal. The
kidneys, ureters and bladder appear normal.

Stomach/Bowel: No evidence of bowel wall thickening, distention or
surrounding inflammatory change. The appendix appears normal.
Prominent stool throughout the colon suggesting constipation.

Vascular/Lymphatic: There are no enlarged abdominal or pelvic lymph
nodes. No significant vascular findings.

Reproductive: The prostate gland and seminal vesicles appear normal.

Other: No evidence of abdominal wall mass or hernia. No ascites.

Musculoskeletal: No acute or significant osseous findings. Mild
convex left scoliosis.
IMPRESSION: 1. No evidence of acute appendicitis or other acute process.
2. Prominent stool throughout the colon, suggesting constipation.

## 2019-10-12 MED FILL — MONTELUKAST SOD 10 MG TAB: 10 | 90 days supply | Qty: 90 | Fill #0

## 2020-04-07 DIAGNOSIS — U071 COVID-19: Secondary | ICD-10-CM

## 2020-04-07 HISTORY — DX: COVID-19: U07.1

## 2020-04-27 ENCOUNTER — Encounter: Payer: Self-pay | Admitting: Nurse Practitioner

## 2020-04-27 ENCOUNTER — Other Ambulatory Visit: Payer: Self-pay | Admitting: Nurse Practitioner

## 2020-04-27 DIAGNOSIS — J4520 Mild intermittent asthma, uncomplicated: Secondary | ICD-10-CM

## 2020-04-27 DIAGNOSIS — U071 COVID-19: Secondary | ICD-10-CM

## 2020-04-27 DIAGNOSIS — J45909 Unspecified asthma, uncomplicated: Secondary | ICD-10-CM | POA: Insufficient documentation

## 2020-04-27 DIAGNOSIS — E663 Overweight: Secondary | ICD-10-CM | POA: Insufficient documentation

## 2020-04-27 NOTE — Progress Notes (Signed)
I connected by phone with Frank Brooks on 04/27/2020 at 10:04 AM to discuss the potential use of a new treatment for mild to moderate COVID-19 viral infection in non-hospitalized patients.  This patient is a 24 y.o. male that meets the FDA criteria for Emergency Use Authorization of COVID monoclonal antibody Sotrovimab.  Has a (+) direct SARS-CoV-2 viral test result  Has mild or moderate COVID-19   Is NOT hospitalized due to COVID-19  Is within 10 days of symptom onset  Has at least one of the high risk factor(s) for progression to severe COVID-19 and/or hospitalization as defined in EUA.  Specific high risk criteria : BMI > 25; asthma   I have spoken and communicated the following to the patient or parent/caregiver regarding COVID monoclonal antibody treatment:  1. FDA has authorized the emergency use for the treatment of mild to moderate COVID-19 in adults and pediatric patients with positive results of direct SARS-CoV-2 viral testing who are 22 years of age and older weighing at least 40 kg, and who are at high risk for progressing to severe COVID-19 and/or hospitalization.  2. The significant known and potential risks and benefits of COVID monoclonal antibody, and the extent to which such potential risks and benefits are unknown.  3. Information on available alternative treatments and the risks and benefits of those alternatives, including clinical trials.  4. Patients treated with COVID monoclonal antibody should continue to self-isolate and use infection control measures (e.g., wear mask, isolate, social distance, avoid sharing personal items, clean and disinfect "high touch" surfaces, and frequent handwashing) according to CDC guidelines.   5. The patient or parent/caregiver has the option to accept or refuse COVID monoclonal antibody treatment.  After reviewing this information with the patient, the patient has agreed to receive one of the available covid 19 monoclonal  antibodies and will be provided an appropriate fact sheet prior to infusion.  Nicolasa Ducking, NP 04/27/2020 10:04 AM

## 2020-04-28 ENCOUNTER — Ambulatory Visit (HOSPITAL_COMMUNITY)
Admission: RE | Admit: 2020-04-28 | Discharge: 2020-04-28 | Disposition: A | Discharge status: Home / Self Care | Payer: No Typology Code available for payment source | Source: Ambulatory Visit | Attending: Pulmonary Disease | Admitting: Pulmonary Disease

## 2020-04-28 DIAGNOSIS — E663 Overweight: Secondary | ICD-10-CM

## 2020-04-28 DIAGNOSIS — Z6825 Body mass index (BMI) 25.0-25.9, adult: Secondary | ICD-10-CM | POA: Diagnosis not present

## 2020-04-28 DIAGNOSIS — U071 COVID-19: Secondary | ICD-10-CM | POA: Diagnosis present

## 2020-04-28 DIAGNOSIS — J4520 Mild intermittent asthma, uncomplicated: Secondary | ICD-10-CM

## 2020-04-28 MED ORDER — FAMOTIDINE IN NACL 20-0.9 MG/50ML-% IV SOLN: 20.0000 mg | Freq: Once | INTRAVENOUS | Status: DC | PRN

## 2020-04-28 MED ORDER — SOTROVIMAB 500 MG/8ML IV SOLN
500.0000 mg | Freq: Once | INTRAVENOUS | Status: AC
  Administered 2020-04-28: 500 mg via INTRAVENOUS

## 2020-04-28 MED ORDER — ALBUTEROL SULFATE HFA 108 (90 BASE) MCG/ACT IN AERS: 2.0000 | INHALATION_SPRAY | Freq: Once | RESPIRATORY_TRACT | Status: DC | PRN

## 2020-04-28 MED ORDER — EPINEPHRINE 0.3 MG/0.3ML IJ SOAJ: 0.3000 mg | Freq: Once | INTRAMUSCULAR | Status: DC | PRN

## 2020-04-28 MED ORDER — SODIUM CHLORIDE 0.9 % IV SOLN: INTRAVENOUS | Status: DC | PRN

## 2020-04-28 MED ORDER — DIPHENHYDRAMINE HCL 50 MG/ML IJ SOLN: 50.0000 mg | Freq: Once | INTRAMUSCULAR | Status: DC | PRN

## 2020-04-28 MED ORDER — METHYLPREDNISOLONE SODIUM SUCC 125 MG IJ SOLR: 125.0000 mg | Freq: Once | INTRAMUSCULAR | Status: DC | PRN

## 2020-04-28 NOTE — Progress Notes (Signed)
Patient reviewed Fact Sheet for Patients, Parents, and Caregivers for Emergency Use Authorization (EUA) of Sotrovimab for the Treatment of Coronavirus. Patient also reviewed and is agreeable to the estimated cost of treatment. Patient is agreeable to proceed.   

## 2020-04-28 NOTE — Discharge Instructions (Signed)

## 2020-10-16 ENCOUNTER — Other Ambulatory Visit (HOSPITAL_COMMUNITY): Payer: Self-pay
